# Patient Record
Sex: Female | Born: 1960 | Race: White | Marital: Married | Smoking: Never smoker
Health system: Southern US, Community
[De-identification: ages and names within clinical notes are randomized; demographics above are authoritative.]

## PROBLEM LIST (undated history)

## (undated) DIAGNOSIS — I00 Rheumatic fever without heart involvement: Secondary | ICD-10-CM

## (undated) DIAGNOSIS — K759 Inflammatory liver disease, unspecified: Secondary | ICD-10-CM

## (undated) HISTORY — PX: TONSILLECTOMY: SUR1361

## (undated) HISTORY — PX: ABDOMINAL HYSTERECTOMY: SHX81

---

## 2012-08-25 HISTORY — PX: JOINT REPLACEMENT: SHX530

## 2019-03-04 ENCOUNTER — Other Ambulatory Visit: Payer: Self-pay

## 2019-03-04 ENCOUNTER — Other Ambulatory Visit: Payer: Self-pay | Admitting: Family Medicine

## 2019-03-04 ENCOUNTER — Ambulatory Visit: Admission: RE | Admit: 2019-03-04 | Payer: Self-pay | Source: Ambulatory Visit

## 2019-03-04 ENCOUNTER — Ambulatory Visit
Admission: RE | Admit: 2019-03-04 | Discharge: 2019-03-04 | Disposition: A | Discharge status: Home / Self Care | Payer: BC Managed Care – PPO | Source: Ambulatory Visit | Attending: Family Medicine | Admitting: Family Medicine

## 2019-03-04 DIAGNOSIS — S79912A Unspecified injury of left hip, initial encounter: Secondary | ICD-10-CM | POA: Insufficient documentation

## 2019-03-06 ENCOUNTER — Ambulatory Visit: Admission: RE | Admit: 2019-03-06 | Payer: Self-pay | Source: Ambulatory Visit

## 2019-03-07 ENCOUNTER — Other Ambulatory Visit: Payer: Self-pay

## 2019-03-07 ENCOUNTER — Encounter
Admission: RE | Admit: 2019-03-07 | Discharge: 2019-03-07 | Disposition: A | Discharge status: Home / Self Care | Payer: BC Managed Care – PPO | Source: Ambulatory Visit | Attending: Orthopedic Surgery | Admitting: Orthopedic Surgery

## 2019-03-07 HISTORY — DX: Rheumatic fever without heart involvement: I00

## 2019-03-07 HISTORY — DX: Inflammatory liver disease, unspecified: K75.9

## 2019-03-07 LAB — SARS CORONAVIRUS 2 BY RT PCR (HOSPITAL ORDER, PERFORMED IN ~~LOC~~ HOSPITAL LAB): SARS Coronavirus 2: NEGATIVE

## 2019-03-07 LAB — CBC
HCT: 42.1 % (ref 36.0–46.0)
Hemoglobin: 13.8 g/dL (ref 12.0–15.0)
MCH: 27.7 pg (ref 26.0–34.0)
MCHC: 32.8 g/dL (ref 30.0–36.0)
MCV: 84.4 fL (ref 80.0–100.0)
Platelets: 231 10*3/uL (ref 150–400)
RBC: 4.99 MIL/uL (ref 3.87–5.11)
RDW: 13.4 % (ref 11.5–15.5)
WBC: 5 10*3/uL (ref 4.0–10.5)
nRBC: 0 % (ref 0.0–0.2)

## 2019-03-07 LAB — SEDIMENTATION RATE: Sed Rate: 2 mm/hr (ref 0–30)

## 2019-03-07 LAB — URINALYSIS, ROUTINE W REFLEX MICROSCOPIC
Bilirubin Urine: NEGATIVE
Glucose, UA: NEGATIVE mg/dL
Hgb urine dipstick: NEGATIVE
Ketones, ur: NEGATIVE mg/dL
Leukocytes,Ua: NEGATIVE
Nitrite: NEGATIVE
Protein, ur: NEGATIVE mg/dL
Specific Gravity, Urine: 1.024 (ref 1.005–1.030)
pH: 5 (ref 5.0–8.0)

## 2019-03-07 LAB — TYPE AND SCREEN
ABO/RH(D): O NEG
Antibody Screen: NEGATIVE

## 2019-03-07 LAB — BASIC METABOLIC PANEL
Anion gap: 9 (ref 5–15)
BUN: 17 mg/dL (ref 6–20)
CO2: 27 mmol/L (ref 22–32)
Calcium: 9.5 mg/dL (ref 8.9–10.3)
Chloride: 102 mmol/L (ref 98–111)
Creatinine, Ser: 0.55 mg/dL (ref 0.44–1.00)
GFR calc Af Amer: >60 mL/min (ref >60)
GFR calc non Af Amer: >60 mL/min (ref >60)
Glucose, Bld: 91 mg/dL (ref 70–99)
Potassium: 4 mmol/L (ref 3.5–5.1)
Sodium: 138 mmol/L (ref 135–145)

## 2019-03-07 LAB — SURGICAL PCR SCREEN
MRSA, PCR: NEGATIVE
Staphylococcus aureus: NEGATIVE

## 2019-03-07 LAB — PROTIME-INR
INR: 1 (ref 0.8–1.2)
Prothrombin Time: 12.8 seconds (ref 11.4–15.2)

## 2019-03-07 LAB — APTT: aPTT: 32 seconds (ref 24–36)

## 2019-03-07 MED ORDER — CEFAZOLIN SODIUM-DEXTROSE 2-4 GM/100ML-% IV SOLN
2.0000 g | Freq: Once | INTRAVENOUS | Status: AC
  Administered 2019-03-08: 2 g via INTRAVENOUS

## 2019-03-07 NOTE — Patient Instructions (Signed)
Your procedure is scheduled on: 03-08-19 TUESDAY Report to Same Day Surgery 2nd floor medical mall [facility] Entrance-take elevator on left to 2nd floor.  Check in with surgery information desk.) @ 2 PM   Remember: Instructions that are not followed completely may result in serious medical risk, up to and including death, or upon the discretion of your surgeon and anesthesiologist your surgery may need to be rescheduled.    _x___ 1. Do not eat food after midnight the night before your procedure. NO GUM OR CANDY AFTER MIDNIGHT. You may drink clear liquids up to 2 hours before you are scheduled to arrive at the hospital for your procedure.  Do not drink clear liquids within 2 hours of your scheduled arrival to the hospital.  Clear liquids include  --Water or Apple juice without pulp  --Clear carbohydrate beverage such as ClearFast or Gatorade  --Black Coffee or Clear Tea (No milk, no creamers, do not add anything to the coffee or Tea   ____Ensure clear carbohydrate drink on the way to the hospital for bariatric patients  ____Ensure clear carbohydrate drink 3 hours before surgery for Dr Dwyane Luo patients if physician instructed.     __x__ 2. No Alcohol for 24 hours before or after surgery.   __x__3. No Smoking or e-cigarettes for 24 prior to surgery.  Do not use any chewable tobacco products for at least 6 hour prior to surgery   ____  4. Bring all medications with you on the day of surgery if instructed.    __x__ 5. Notify your doctor if there is any change in your medical condition     (cold, fever, infections).    x___6. On the morning of surgery brush your teeth with toothpaste and water.  You may rinse your mouth with mouth wash if you wish.  Do not swallow any toothpaste or mouthwash.   Do not wear jewelry, make-up, hairpins, clips or nail polish.  Do not wear lotions, powders, or perfumes. You may wear deodorant.  Do not shave 48 hours prior to surgery. Men may shave face and  neck.  Do not bring valuables to the hospital.    [facility] is not responsible for any belongings or valuables.               Contacts, dentures or bridgework may not be worn into surgery.  Leave your suitcase in the car. After surgery it may be brought to your room.  For patients admitted to the hospital, discharge time is determined by your treatment team.  _  Patients discharged the day of surgery will not be allowed to drive home.  You will need someone to drive you home and stay with you the night of your procedure.    Please read over the following fact sheets that you were given:   [facility] Preparing for Surgery   ____ Take anti-hypertensive listed below, cardiac, seizure, asthma, anti-reflux and psychiatric medicines. These include:  1. YOU MAY TAKE HYDROCODONE DAY OF SURGERY IF NEEDED WITH A SMALL SIP OF WATER  2.  3.  4.  5.  6.  ____Fleets enema or Magnesium Citrate as directed.   _x___ Use CHG Soap or sage wipes as directed on instruction sheet   ____ Use inhalers on the day of surgery and bring to hospital day of surgery  ____ Stop Metformin and Janumet 2 days prior to surgery.    ____ Take 1/2 of usual insulin dose the night before surgery and none  on the morning surgery.   ____ Follow recommendations from Cardiologist, Pulmonologist or PCP regarding  stopping Aspirin, Coumadin, Plavix ,Eliquis, Effient, or Pradaxa, and Pletal.  X____Stop Anti-inflammatories such as Advil, Aleve, Ibuprofen, Motrin, Naproxen, Naprosyn, Goodies powders or aspirin products NOW-OK to take Tylenol OR HYDROCODONE    ____ Stop supplements until after surgery.     ____ Bring C-Pap to the hospital.

## 2019-03-08 ENCOUNTER — Inpatient Hospital Stay: Payer: BC Managed Care – PPO

## 2019-03-08 ENCOUNTER — Inpatient Hospital Stay: Payer: BC Managed Care – PPO | Admitting: Anesthesiology

## 2019-03-08 ENCOUNTER — Encounter: Payer: Self-pay | Admitting: *Deleted

## 2019-03-08 ENCOUNTER — Other Ambulatory Visit: Payer: Self-pay

## 2019-03-08 ENCOUNTER — Inpatient Hospital Stay
Admission: RE | Admit: 2019-03-08 | Discharge: 2019-03-09 | DRG: 482 | Disposition: A | Discharge status: Home Health | Payer: BC Managed Care – PPO | Attending: Orthopedic Surgery | Admitting: Orthopedic Surgery

## 2019-03-08 ENCOUNTER — Encounter
Admission: RE | Disposition: A | Discharge status: Home / Self Care | Payer: Self-pay | Source: Home / Self Care | Attending: Orthopedic Surgery

## 2019-03-08 DIAGNOSIS — Z96652 Presence of left artificial knee joint: Secondary | ICD-10-CM | POA: Diagnosis present

## 2019-03-08 DIAGNOSIS — W010XXA Fall on same level from slipping, tripping and stumbling without subsequent striking against object, initial encounter: Secondary | ICD-10-CM | POA: Diagnosis present

## 2019-03-08 DIAGNOSIS — S72002A Fracture of unspecified part of neck of left femur, initial encounter for closed fracture: Principal | ICD-10-CM | POA: Diagnosis present

## 2019-03-08 DIAGNOSIS — Z1159 Encounter for screening for other viral diseases: Secondary | ICD-10-CM

## 2019-03-08 DIAGNOSIS — Z90710 Acquired absence of both cervix and uterus: Secondary | ICD-10-CM | POA: Diagnosis not present

## 2019-03-08 DIAGNOSIS — Z419 Encounter for procedure for purposes other than remedying health state, unspecified: Secondary | ICD-10-CM

## 2019-03-08 DIAGNOSIS — S72009A Fracture of unspecified part of neck of unspecified femur, initial encounter for closed fracture: Secondary | ICD-10-CM | POA: Diagnosis present

## 2019-03-08 HISTORY — PX: HIP PINNING,CANNULATED: SHX1758

## 2019-03-08 LAB — URINE CULTURE

## 2019-03-08 LAB — ABO/RH: ABO/RH(D): O NEG

## 2019-03-08 SURGERY — FIXATION, FEMUR, NECK, PERCUTANEOUS, USING SCREW
Anesthesia: Spinal | Site: Hip | Laterality: Left

## 2019-03-08 MED ORDER — CEFAZOLIN SODIUM-DEXTROSE 2-4 GM/100ML-% IV SOLN
INTRAVENOUS | Status: AC
  Filled 2019-03-08: qty 100

## 2019-03-08 MED ORDER — TRAMADOL HCL 50 MG PO TABS
50.0000 mg | ORAL_TABLET | Freq: Four times a day (QID) | ORAL | Status: DC | PRN
  Administered 2019-03-08 – 2019-03-09 (×2): 50 mg via ORAL
  Filled 2019-03-08 (×2): qty 1

## 2019-03-08 MED ORDER — DIPHENHYDRAMINE HCL 12.5 MG/5ML PO ELIX: 12.5000 mg | ORAL_SOLUTION | ORAL | Status: DC | PRN

## 2019-03-08 MED ORDER — NEOMYCIN-POLYMYXIN B GU 40-200000 IR SOLN
Status: AC
  Filled 2019-03-08: qty 2

## 2019-03-08 MED ORDER — PROPOFOL 500 MG/50ML IV EMUL
INTRAVENOUS | Status: AC
  Filled 2019-03-08: qty 50

## 2019-03-08 MED ORDER — METOCLOPRAMIDE HCL 5 MG/ML IJ SOLN: 5.0000 mg | Freq: Three times a day (TID) | INTRAMUSCULAR | Status: DC | PRN

## 2019-03-08 MED ORDER — BUPIVACAINE HCL (PF) 0.5 % IJ SOLN
INTRAMUSCULAR | Status: DC | PRN
  Administered 2019-03-08: 3 mL

## 2019-03-08 MED ORDER — OXYCODONE HCL 5 MG PO TABS
5.0000 mg | ORAL_TABLET | ORAL | Status: DC | PRN
  Administered 2019-03-09 (×3): 10 mg via ORAL
  Filled 2019-03-08 (×3): qty 2

## 2019-03-08 MED ORDER — BUPIVACAINE LIPOSOME 1.3 % IJ SUSP
INTRAMUSCULAR | Status: AC
  Filled 2019-03-08: qty 20

## 2019-03-08 MED ORDER — BUPIVACAINE LIPOSOME 1.3 % IJ SUSP
INTRAMUSCULAR | Status: DC | PRN
  Administered 2019-03-08: 30 mL

## 2019-03-08 MED ORDER — HYDROMORPHONE HCL 1 MG/ML IJ SOLN: 0.2500 mg | INTRAMUSCULAR | Status: DC | PRN

## 2019-03-08 MED ORDER — FLEET ENEMA 7-19 GM/118ML RE ENEM: 1.0000 | ENEMA | Freq: Once | RECTAL | Status: DC | PRN

## 2019-03-08 MED ORDER — PROPOFOL 10 MG/ML IV BOLUS
INTRAVENOUS | Status: DC | PRN
  Administered 2019-03-08: 50 mg via INTRAVENOUS

## 2019-03-08 MED ORDER — MIDAZOLAM HCL 2 MG/2ML IJ SOLN
INTRAMUSCULAR | Status: AC
  Filled 2019-03-08: qty 2

## 2019-03-08 MED ORDER — FAMOTIDINE 20 MG PO TABS
20.0000 mg | ORAL_TABLET | Freq: Once | ORAL | Status: AC
  Administered 2019-03-08: 20 mg via ORAL

## 2019-03-08 MED ORDER — FENTANYL CITRATE (PF) 100 MCG/2ML IJ SOLN: 25.0000 ug | INTRAMUSCULAR | Status: DC | PRN

## 2019-03-08 MED ORDER — ONDANSETRON HCL 4 MG PO TABS: 4.0000 mg | ORAL_TABLET | Freq: Four times a day (QID) | ORAL | Status: DC | PRN

## 2019-03-08 MED ORDER — FENTANYL CITRATE (PF) 100 MCG/2ML IJ SOLN
INTRAMUSCULAR | Status: DC | PRN
  Administered 2019-03-08: 25 ug via INTRAVENOUS

## 2019-03-08 MED ORDER — OXYCODONE HCL 5 MG PO TABS
2.5000 mg | ORAL_TABLET | ORAL | Status: DC | PRN
  Administered 2019-03-08: 5 mg via ORAL
  Filled 2019-03-08: qty 1

## 2019-03-08 MED ORDER — SODIUM CHLORIDE 0.9 % IV SOLN
INTRAVENOUS | Status: DC
  Administered 2019-03-08 (×2): via INTRAVENOUS

## 2019-03-08 MED ORDER — OXYCODONE HCL 5 MG PO TABS: 5.0000 mg | ORAL_TABLET | Freq: Once | ORAL | Status: DC | PRN

## 2019-03-08 MED ORDER — ACETAMINOPHEN 500 MG PO TABS
1000.0000 mg | ORAL_TABLET | Freq: Three times a day (TID) | ORAL | Status: DC
  Administered 2019-03-08 – 2019-03-09 (×3): 1000 mg via ORAL
  Filled 2019-03-08 (×3): qty 2

## 2019-03-08 MED ORDER — METHOCARBAMOL 1000 MG/10ML IJ SOLN
500.0000 mg | Freq: Four times a day (QID) | INTRAVENOUS | Status: DC | PRN
  Filled 2019-03-08: qty 5

## 2019-03-08 MED ORDER — FAMOTIDINE 20 MG PO TABS
ORAL_TABLET | ORAL | Status: AC
  Filled 2019-03-08: qty 1

## 2019-03-08 MED ORDER — FENTANYL CITRATE (PF) 100 MCG/2ML IJ SOLN
INTRAMUSCULAR | Status: AC
  Filled 2019-03-08: qty 2

## 2019-03-08 MED ORDER — ACETAMINOPHEN 10 MG/ML IV SOLN
INTRAVENOUS | Status: AC
  Filled 2019-03-08: qty 100

## 2019-03-08 MED ORDER — ONDANSETRON HCL 4 MG/2ML IJ SOLN: 4.0000 mg | Freq: Four times a day (QID) | INTRAMUSCULAR | Status: DC | PRN

## 2019-03-08 MED ORDER — SENNOSIDES-DOCUSATE SODIUM 8.6-50 MG PO TABS: 1.0000 | ORAL_TABLET | Freq: Every evening | ORAL | Status: DC | PRN

## 2019-03-08 MED ORDER — METOCLOPRAMIDE HCL 10 MG PO TABS: 5.0000 mg | ORAL_TABLET | Freq: Three times a day (TID) | ORAL | Status: DC | PRN

## 2019-03-08 MED ORDER — ASPIRIN EC 325 MG PO TBEC
325.0000 mg | DELAYED_RELEASE_TABLET | Freq: Two times a day (BID) | ORAL | Status: DC
  Administered 2019-03-09: 325 mg via ORAL
  Filled 2019-03-08: qty 1

## 2019-03-08 MED ORDER — PHENYLEPHRINE HCL (PRESSORS) 10 MG/ML IV SOLN
INTRAVENOUS | Status: AC
  Filled 2019-03-08: qty 1

## 2019-03-08 MED ORDER — METHOCARBAMOL 500 MG PO TABS
500.0000 mg | ORAL_TABLET | Freq: Four times a day (QID) | ORAL | Status: DC | PRN
  Administered 2019-03-08 – 2019-03-09 (×2): 500 mg via ORAL
  Filled 2019-03-08 (×2): qty 1

## 2019-03-08 MED ORDER — BUPIVACAINE HCL (PF) 0.5 % IJ SOLN
INTRAMUSCULAR | Status: AC
  Filled 2019-03-08: qty 30

## 2019-03-08 MED ORDER — PROPOFOL 500 MG/50ML IV EMUL
INTRAVENOUS | Status: DC | PRN
  Administered 2019-03-08: 100 ug/kg/min via INTRAVENOUS

## 2019-03-08 MED ORDER — BISACODYL 10 MG RE SUPP: 10.0000 mg | Freq: Every day | RECTAL | Status: DC | PRN

## 2019-03-08 MED ORDER — ACETAMINOPHEN 10 MG/ML IV SOLN
INTRAVENOUS | Status: DC | PRN
  Administered 2019-03-08: 1000 mg via INTRAVENOUS

## 2019-03-08 MED ORDER — BUPIVACAINE HCL (PF) 0.5 % IJ SOLN
INTRAMUSCULAR | Status: AC
  Filled 2019-03-08: qty 10

## 2019-03-08 MED ORDER — OXYCODONE HCL 5 MG/5ML PO SOLN: 5.0000 mg | Freq: Once | ORAL | Status: DC | PRN

## 2019-03-08 MED ORDER — SODIUM CHLORIDE 0.9 % IR SOLN
Status: DC | PRN
  Administered 2019-03-08: 500 mL

## 2019-03-08 MED ORDER — EPHEDRINE SULFATE 50 MG/ML IJ SOLN
INTRAMUSCULAR | Status: DC | PRN
  Administered 2019-03-08 (×3): 10 mg via INTRAVENOUS

## 2019-03-08 MED ORDER — DOCUSATE SODIUM 100 MG PO CAPS
100.0000 mg | ORAL_CAPSULE | Freq: Two times a day (BID) | ORAL | Status: DC
  Administered 2019-03-08 – 2019-03-09 (×2): 100 mg via ORAL
  Filled 2019-03-08 (×2): qty 1

## 2019-03-08 MED ORDER — MIDAZOLAM HCL 5 MG/5ML IJ SOLN
INTRAMUSCULAR | Status: DC | PRN
  Administered 2019-03-08: 2 mg via INTRAVENOUS

## 2019-03-08 MED ORDER — CEFAZOLIN SODIUM-DEXTROSE 1-4 GM/50ML-% IV SOLN
1.0000 g | Freq: Four times a day (QID) | INTRAVENOUS | Status: AC
  Administered 2019-03-08 – 2019-03-09 (×2): 1 g via INTRAVENOUS
  Filled 2019-03-08 (×3): qty 50

## 2019-03-08 MED ORDER — EPHEDRINE SULFATE 50 MG/ML IJ SOLN
INTRAMUSCULAR | Status: AC
  Filled 2019-03-08: qty 1

## 2019-03-08 MED ORDER — LACTATED RINGERS IV SOLN
INTRAVENOUS | Status: DC
  Administered 2019-03-08: 15:00:00 via INTRAVENOUS

## 2019-03-08 SURGICAL SUPPLY — 46 items
BIT DRILL 4.9 CANNULATED (BIT) ×1
BIT DRILL CANN QC 4.9 LRG (BIT) IMPLANT
BLADE SURG 15 STRL LF DISP TIS (BLADE) ×1 IMPLANT
BLADE SURG 15 STRL SS (BLADE) ×1
CANISTER SUCT 1200ML W/VALVE (MISCELLANEOUS) ×2 IMPLANT
CHLORAPREP W/TINT 26 (MISCELLANEOUS) ×2 IMPLANT
COVER WAND RF STERILE (DRAPES) ×2 IMPLANT
DRAPE SHEET LG 3/4 BI-LAMINATE (DRAPES) ×3 IMPLANT
DRAPE SURG 17X11 SM STRL (DRAPES) ×4 IMPLANT
DRAPE U-SHAPE 47X51 STRL (DRAPES) ×4 IMPLANT
DRILL BIT CANNULATED 4.9 (BIT) ×1
DRSG OPSITE POSTOP 3X4 (GAUZE/BANDAGES/DRESSINGS) ×6 IMPLANT
ELECT REM PT RETURN 9FT ADLT (ELECTROSURGICAL) ×2
ELECTRODE REM PT RTRN 9FT ADLT (ELECTROSURGICAL) ×1 IMPLANT
GAUZE XEROFORM 1X8 LF (GAUZE/BANDAGES/DRESSINGS) ×2 IMPLANT
GLOVE BIOGEL PI IND STRL 8 (GLOVE) ×1 IMPLANT
GLOVE BIOGEL PI INDICATOR 8 (GLOVE) ×1
GLOVE SURG SYN 7.5  E (GLOVE) ×2
GLOVE SURG SYN 7.5 E (GLOVE) ×2 IMPLANT
GLOVE SURG SYN 7.5 PF PI (GLOVE) ×2 IMPLANT
GOWN STRL REUS W/ TWL LRG LVL3 (GOWN DISPOSABLE) ×1 IMPLANT
GOWN STRL REUS W/ TWL XL LVL3 (GOWN DISPOSABLE) ×1 IMPLANT
GOWN STRL REUS W/TWL LRG LVL3 (GOWN DISPOSABLE) ×1
GOWN STRL REUS W/TWL XL LVL3 (GOWN DISPOSABLE) ×1
GUIDEWIRE THRD ASNIS 3.2X300 (WIRE) ×3 IMPLANT
KIT TURNOVER CYSTO (KITS) ×2 IMPLANT
MAT ABSORB  FLUID 56X50 GRAY (MISCELLANEOUS) ×1
MAT ABSORB FLUID 56X50 GRAY (MISCELLANEOUS) ×2 IMPLANT
NDL FILTER BLUNT 18X1 1/2 (NEEDLE) ×1 IMPLANT
NEEDLE FILTER BLUNT 18X 1/2SAF (NEEDLE) ×1
NEEDLE FILTER BLUNT 18X1 1/2 (NEEDLE) ×1 IMPLANT
NEEDLE HYPO 22GX1.5 SAFETY (NEEDLE) ×2 IMPLANT
NS IRRIG 500ML POUR BTL (IV SOLUTION) ×2 IMPLANT
PACK HIP COMPR (MISCELLANEOUS) ×2 IMPLANT
PENCIL ELECTRO HAND CTR (MISCELLANEOUS) ×2 IMPLANT
SCREW ASNIS 100MM (Screw) ×1 IMPLANT
SCREW ASNIS 90MM (Screw) ×1 IMPLANT
SCREW ASNIS 95MM (Screw) ×1 IMPLANT
STAPLER SKIN PROX 35W (STAPLE) ×2 IMPLANT
SUT VIC AB 0 CT1 36 (SUTURE) ×2 IMPLANT
SUT VIC AB 2-0 CT1 27 (SUTURE) ×1
SUT VIC AB 2-0 CT1 TAPERPNT 27 (SUTURE) ×1 IMPLANT
SYR 30ML LL (SYRINGE) ×2 IMPLANT
SYR 5ML LL (SYRINGE) ×2 IMPLANT
TAPE CLOTH 3X10 WHT NS LF (GAUZE/BANDAGES/DRESSINGS) ×2 IMPLANT
WASHER SCREW MATTA SS 13.0X1.5 (Washer) ×3 IMPLANT

## 2019-03-08 NOTE — H&P (Signed)
Paper H&P to be scanned into permanent record. H&P reviewed. No significant changes noted.  

## 2019-03-08 NOTE — Op Note (Signed)
DATE OF SURGERY: 03/08/2019  PREOPERATIVE DIAGNOSIS: Left non-displaced femoral neck fracture  POSTOPERATIVE DIAGNOSIS: Left non-displaced femoral neck fracture  PROCEDURE: Percutaneous pinning of Left femoral neck fracture  SURGEON: Cato Mulligan, MD  ASSISTANTS: none  EBL: 50 cc  COMPONENTS:  Stryker 6.75mm cannulated screws x 3 with washers (148mm, 33mm, 24mm)   INDICATIONS: Alejandra Webb is a 58 y.o. female who sustained a non-displaced femoral neck fracture identified on MRI after she was knocked over by 2 dogs at a dog park one week ago. The patient had progressively worsening pain such that there is now significant pain even with slight movement of the hip and leg. Risks and benefits of percutaneous pinning were explained to the patient. Risks include but are not limited to bleeding, infection, injury to tissues, nerves, vessels, DVT/PE, malunion/nonunion, hardware failure, and risks of anesthesia. The patient understands these risks, has completed an informed consent, and wishes to proceed.   PROCEDURE:  The patient was brought into the operating room. After administering spinal anesthesia, the patient was placed in the supine position on the Hana table. The uninjured leg was extended while the injured lower extremity was placed in a neutral position with care taken to not displace the fracture during positioning. The lateral aspects of the operative hip and thigh were prepped with ChloraPrep solution before being draped sterilely. IV antibiotics were administered. A timeout was performed to verify the appropriate surgical site, patient, and procedure.   The greater trochanter was identified and an approximately 5 cm incision was made over the lateral aspect of the proximal femur just distal to the greater trochanter. The incision was carried down through the subcutaneous tissues to expose the IT band. This was split at the proximal portion of the incision and the vastus  lateralis was split in line with its fibers. The lateral aspect of the femur was cleared of soft tissue. Under fluoroscopic guidance, a guidewire was placed along the inferior aspect of the femoral neck into the head while ensuring the start point on the lateral cortex was not below the level of the lesser trochanter. A parallel guide was used to place two additional guidepins (superior posterior and superior anterior). Position of all pins was verified fluoroscopically in both the AP and lateral views. A measuring device was used the measure appropriate screw length. The near cortex was drilled. Appropriately sized screws were advanced starting with the inferior screw, then superior posterior, then superior anterior. Screws were sequentially tightened. Hardware position and bony alignment was confirmed fluoroscopically with AP and lateral views as well as live fluorsocopy.   The wounds were irrigated thoroughly with sterile saline solution. Local anesthetic was injected. The IT band was closed with 0-Vicryl. Deep fat sutures were also placed with 0-Vicryl. The subcutaneous tissues were closed using 2-0 Vicryl interrupted sutures. The skin was closed using staples. Sterile occlusive dressing was applied. The patient was then transferred to the recovery room in satisfactory condition after tolerating the procedure well.  POSTOPERATIVE PLAN: The patient will be FFWB on the operative extremity. ASA 325mg  bid x 4 weeks to start on POD#1 for DVT ppx. Ancef x 24 hours. PT/OT on POD#1.

## 2019-03-08 NOTE — Transfer of Care (Signed)
Immediate Anesthesia Transfer of Care Note  Patient: Rena Hunke  Procedure(s) Performed: Procedure(s): Percutanceous HIP PINNING (Left)  Patient Location: PACU  Anesthesia Type:Spinal  Level of Consciousness: awake, alert  and oriented  Airway & Oxygen Therapy: Patient Spontanous Breathing and Patient connected to face mask oxygen  Post-op Assessment: Report given to RN and Post -op Vital signs reviewed and stable  Post vital signs: Reviewed and stable  Last Vitals:  Vitals:   03/08/19 1434 03/08/19 1656  BP: 106/66 (!) 107/58  Pulse: (!) 55 (!) 58  Resp: 16 14  Temp: (!) 36 C (!) 35.9 C  SpO2: 03% 70%    Complications: No apparent anesthesia complications

## 2019-03-08 NOTE — Anesthesia Preprocedure Evaluation (Addendum)
Anesthesia Evaluation  Patient identified by MRN, date of birth, ID band Patient awake    Reviewed: Allergy & Precautions, H&P , NPO status , Patient's Chart, lab work & pertinent test results  Airway Mallampati: II  TM Distance: >3 FB Neck ROM: full    Dental  (+) Poor Dentition   Pulmonary neg pulmonary ROS, neg shortness of breath, neg COPD, neg recent URI,           Cardiovascular (-) angina(-) Past MI and (-) Cardiac Stents negative cardio ROS  (-) dysrhythmias      Neuro/Psych negative neurological ROS  negative psych ROS   GI/Hepatic negative GI ROS, (+) Hepatitis - (remote history)  Endo/Other  negative endocrine ROS  Renal/GU      Musculoskeletal   Abdominal   Peds  Hematology negative hematology ROS (+)   Anesthesia Other Findings Past Medical History: No date: Hepatitis     Comment:  a or b-pt unsure-32 years ago No date: Rheumatic fever  Past Surgical History: No date: ABDOMINAL HYSTERECTOMY No date: CESAREAN SECTION     Comment:  x2 2014: JOINT REPLACEMENT; Left     Comment:  tkr  No date: TONSILLECTOMY     Comment:  age 52's  BMI    Body Mass Index: 21.95 kg/m      Reproductive/Obstetrics negative OB ROS                           Anesthesia Physical Anesthesia Plan  ASA: I  Anesthesia Plan: Spinal   Post-op Pain Management:    Induction:   PONV Risk Score and Plan:   Airway Management Planned: Natural Airway and Nasal Cannula  Additional Equipment:   Intra-op Plan:   Post-operative Plan:   Informed Consent: I have reviewed the patients History and Physical, chart, labs and discussed the procedure including the risks, benefits and alternatives for the proposed anesthesia with the patient or authorized representative who has indicated his/her understanding and acceptance.     Dental Advisory Given  Plan Discussed with: Anesthesiologist and  CRNA  Anesthesia Plan Comments:        Anesthesia Quick Evaluation

## 2019-03-08 NOTE — Anesthesia Procedure Notes (Signed)
Spinal  Patient location during procedure: OR Start time: 03/08/2019 3:13 PM End time: 03/08/2019 3:19 PM Staffing Anesthesiologist: Durenda Hurt, MD Resident/CRNA: Doreen Salvage, CRNA Performed: resident/CRNA  Preanesthetic Checklist Completed: patient identified, site marked, surgical consent, pre-op evaluation, timeout performed, IV checked, risks and benefits discussed and monitors and equipment checked Spinal Block Patient position: sitting Prep: ChloraPrep Patient monitoring: heart rate, continuous pulse ox, blood pressure and cardiac monitor Approach: midline Location: L3-4 Injection technique: single-shot Needle Needle type: Whitacre and Introducer  Needle gauge: 24 G Needle length: 9 cm Assessment Sensory level: T10 Additional Notes Negative paresthesia. Negative blood return. Positive free-flowing CSF. Expiration date of kit checked and confirmed. Patient tolerated procedure well, without complications.

## 2019-03-08 NOTE — Anesthesia Procedure Notes (Signed)
Date/Time: 03/08/2019 3:14 PM Performed by: Doreen Salvage, CRNA Pre-anesthesia Checklist: Patient identified, Emergency Drugs available, Suction available and Patient being monitored Patient Re-evaluated:Patient Re-evaluated prior to induction Oxygen Delivery Method: Simple face mask Induction Type: IV induction Dental Injury: Teeth and Oropharynx as per pre-operative assessment

## 2019-03-08 NOTE — Anesthesia Post-op Follow-up Note (Signed)
Anesthesia QCDR form completed.        

## 2019-03-09 LAB — BASIC METABOLIC PANEL
Anion gap: 6 (ref 5–15)
BUN: 15 mg/dL (ref 6–20)
CO2: 27 mmol/L (ref 22–32)
Calcium: 8.6 mg/dL — ABNORMAL LOW (ref 8.9–10.3)
Chloride: 107 mmol/L (ref 98–111)
Creatinine, Ser: 0.63 mg/dL (ref 0.44–1.00)
GFR calc Af Amer: >60 mL/min (ref >60)
GFR calc non Af Amer: >60 mL/min (ref >60)
Glucose, Bld: 120 mg/dL — ABNORMAL HIGH (ref 70–99)
Potassium: 4.3 mmol/L (ref 3.5–5.1)
Sodium: 140 mmol/L (ref 135–145)

## 2019-03-09 LAB — CBC
HCT: 35.4 % — ABNORMAL LOW (ref 36.0–46.0)
Hemoglobin: 11.5 g/dL — ABNORMAL LOW (ref 12.0–15.0)
MCH: 27.3 pg (ref 26.0–34.0)
MCHC: 32.5 g/dL (ref 30.0–36.0)
MCV: 84.1 fL (ref 80.0–100.0)
Platelets: 193 10*3/uL (ref 150–400)
RBC: 4.21 MIL/uL (ref 3.87–5.11)
RDW: 13.3 % (ref 11.5–15.5)
WBC: 6.4 10*3/uL (ref 4.0–10.5)
nRBC: 0 % (ref 0.0–0.2)

## 2019-03-09 MED ORDER — ASPIRIN 325 MG PO TBEC: 325.0000 mg | DELAYED_RELEASE_TABLET | Freq: Two times a day (BID) | ORAL | 0 refills | Status: DC

## 2019-03-09 MED ORDER — OXYCODONE HCL 5 MG PO TABS: 5.0000 mg | ORAL_TABLET | ORAL | 0 refills | Status: DC | PRN

## 2019-03-09 MED ORDER — TRAMADOL HCL 50 MG PO TABS: 50.0000 mg | ORAL_TABLET | Freq: Four times a day (QID) | ORAL | 1 refills | Status: DC | PRN

## 2019-03-09 NOTE — Discharge Summary (Signed)
Physician Discharge Summary  Subjective: 1 Day Post-Op Procedure(s) (LRB): Percutanceous HIP PINNING (Left) Patient reports pain as moderate.   Patient seen in rounds with Dr. Allena KatzPatel. Patient is well, and has had no acute complaints or problems Patient is ready to go home with home health physical therapy.  Physician Discharge Summary  Patient ID: Alejandra Webb MRN: [id number] DOB/AGE: 60/10/1960 60 y.o.  Admit date: 03/08/2019 Discharge date: 03/09/2019  Admission Diagnoses:  Discharge Diagnoses:  Active Problems:   Hip fracture [facility](HCC)   Discharged Condition: fair  Hospital Course: The patient is postop day 1 from a left hip percutaneous pinning.  The patient has done well since surgery.  She is still working on pain management.  She will be doing physical therapy today before going home.  Treatments: surgery:  Percutaneous pinning of Left femoral neck fracture  SURGEON: Rosealee AlbeeSunny H. Patel, MD  ASSISTANTS: none  EBL: 50 cc  COMPONENTS:  Stryker 6.465mm cannulated screws x 3 with washers (100mm, 95mm, 90mm)   Discharge Exam: Blood pressure 117/61, pulse 65, temperature 98.5 F (36.9 C), temperature source Oral, resp. rate 19, height 5\' 6"  (1.676 m), weight 61.8 kg, SpO2 97 %.   Disposition: Discharge disposition: 01-Home or Self Care        Allergies as of 03/09/2019   No Known Allergies     Medication List    STOP taking these medications   HYDROcodone-acetaminophen 5-325 MG tablet Commonly known as: NORCO/VICODIN     TAKE these medications   aspirin 325 MG EC tablet Take 1 tablet (325 mg total) by mouth 2 (two) times a day.   ibuprofen 200 MG tablet Commonly known as: ADVIL Take 600 mg by mouth every 6 (six) hours as needed.   oxyCODONE 5 MG immediate release tablet Commonly known as: Oxy IR/ROXICODONE Take 1-2 tablets (5-10 mg total) by mouth every 4 (four) hours as needed for severe pain (pain score 7-10).   traMADol 50 MG  tablet Commonly known as: ULTRAM Take 1 tablet (50 mg total) by mouth every 6 (six) hours as needed for moderate pain.      Follow-up Information    Dedra SkeensMundy, Tracy Gerken, PA-C Follow up in 2 week(s).   Specialty: Orthopedic Surgery Why: For staple removal Contact information: [address] [facility] [place] [id number] [phone]           Signed: Lenard ForthMUNDY, Nataly Pacifico 03/09/2019, 7:00 AM   Objective: Vital signs in last 24 hours: Temp:  [96.7 F (35.9 C)-98.5 F (36.9 C)] 98.5 F (36.9 C) (07/15 0431) Pulse Rate:  [50-70] 65 (07/15 0431) Resp:  [11-20] 19 (07/15 0431) BP: (106-134)/(53-66) 117/61 (07/15 0431) SpO2:  [97 %-100 %] 97 % (07/15 0431) Weight:  [61.8 kg] 61.8 kg (07/14 2314)  Intake/Output from previous day:  Intake/Output Summary (Last 24 hours) at 03/09/2019 0700 Last data filed at 03/09/2019 0425 Gross per 24 hour  Intake 1518.66 ml  Output 1000 ml  Net 518.66 ml    Intake/Output this shift: Total I/O In: 718.7 [I.V.:618.7; IV Piggyback:100] Out: 200 [Urine:200]  Labs: Recent Labs    03/07/19 1324 03/09/19 0434  HGB 13.8 11.5*   Recent Labs    03/07/19 1324 03/09/19 0434  WBC 5.0 6.4  RBC 4.99 4.21  HCT 42.1 35.4*  PLT 231 193   Recent Labs    03/07/19 1324 03/09/19 0434  NA 138 140  K 4.0 4.3  CL 102 107  CO2 27 27  BUN 17 15  CREATININE 0.55 0.63  GLUCOSE 91 120*  CALCIUM 9.5 8.6*   Recent Labs    03/07/19 1324  INR 1.0    EXAM: General - Patient is Alert and Oriented Extremity - Neurovascular intact Sensation intact distally Dorsiflexion/Plantar flexion intact Compartment soft Incision - clean, dry, no drainage Motor Function -plantarflexion and dorsiflexion are intact  Assessment/Plan: 1 Day Post-Op Procedure(s) (LRB): Percutanceous HIP PINNING (Left) Procedure(s) (LRB): Percutanceous HIP PINNING (Left) Past Medical History:  Diagnosis Date  . Hepatitis    a or b-pt unsure-32 years ago  .  Rheumatic fever    Active Problems:   Hip fracture (HCC)  Estimated body mass index is 21.99 kg/m as calculated from the following:   Height as of this encounter: 5\' 6"  (1.676 m).   Weight as of this encounter: 61.8 kg. Advance diet Up with therapy D/C IV fluids Discharge home with home health Diet - Regular diet Follow up - in 2 weeks Activity -forward foot weight-bear Disposition - Home Condition Upon Discharge - Stable DVT Prophylaxis - Aspirin and TED hose  Reche Dixon, PA-C Orthopaedic Surgery 03/09/2019, 7:00 AM

## 2019-03-09 NOTE — TOC Progression Note (Signed)
Transition of Care [facility]) - Progression Note    Patient Details  Name: Cassidie Veiga MRN: [id number] Date of Birth: 04-16-1961  [facility] CM/SW [place], RN Phone Number: 03/09/2019, 3:50 PM  Clinical Narrative:     The patient decided she would  like a bed side commode, I notified Brad with [employer]  Expected Discharge Plan: [place] Barriers to Discharge: Barriers Resolved  Expected Discharge Plan and Services Expected Discharge Plan: [place]   Discharge Planning Services: CM Consult Post Acute Care Choice: [place] arrangements for the past 2 months: Apartment Expected Discharge Date: 03/09/19               DME Arranged: N/A         HH Arranged: PT, OT HH Agency: [facility] [place] Date [place]: 03/09/19 Time [place]: [phone] Representative spoke with at [place]: [place] ([place]) Interventions    Readmission Risk Interventions No flowsheet data found.

## 2019-03-09 NOTE — Evaluation (Signed)
Occupational Therapy Evaluation Patient Details Name: Alejandra Webb MRN: [id number] DOB: [birth year] Today's Date: 03/09/2019    History of Present Illness Pt is a 58 yo female diagnosed with a Left non-displaced femoral neck fracture from a mechanical fall who is now s/p Percutaneous pinning of Left femoral neck fracture.   Clinical Impression   Pt seen for OT evaluation this date POD #1 L hip pinning. Prior to hospital admission, pt was I with all ADLs/IADLs.  Pt lives with daughter in third story apartment with no elevator-[address].  Currently pt demonstrates impairments in LB ADLs and fxl transfers, requiring MIN A for LB dressing (SUPV with AE), and CGA with sit<>stand with crutches and for fxl mobility.  No further OT needs detected as pt demos good understanding of use of AE PRN as well as safe use of AD for ADL transfers and fxl mobility, and her daughter is available to assist upon pt d/c home.     Follow Up Recommendations  No OT follow up    Equipment Recommendations  Tub/shower seat    Recommendations for Other Services       Precautions / Restrictions Precautions Precautions: Fall Restrictions Weight Bearing Restrictions: Yes LLE Weight Bearing: Touchdown weight bearing(TTWB with foot flat position (FFWB)) Other Position/Activity Restrictions: LLE FFWB      Mobility Bed Mobility Overal bed mobility: Modified Independent             General bed mobility comments: pt up in chair when recieved by OT  Transfers Overall transfer level: Needs assistance Equipment used: Rolling walker (2 wheeled);Crutches Transfers: Sit to/from Stand Sit to Stand: Min guard         General transfer comment: Pt requires MIN verbal cues to sequence placement of hands/feet with use of crutches    Balance Overall balance assessment: Needs assistance Sitting-balance support: No upper extremity supported Sitting balance-Leahy Scale: Normal     Standing balance support:  Bilateral upper extremity supported Standing balance-Leahy Scale: Fair Standing balance comment: G+/F- static standing balance, requires CGA-SUPV level                           ADL either performed or assessed with clinical judgement   ADL Overall ADL's : Needs assistance/impaired Eating/Feeding: Independent   Grooming: Wash/dry hands;Wash/dry face;Set up   Upper Body Bathing: Set up   Lower Body Bathing: Supervison/ safety;Set up;Sitting/lateral leans   Upper Body Dressing : Set up   Lower Body Dressing: Minimal assistance   Toilet Transfer: Supervision/safety;Min guard Toilet Transfer Details (indicate cue type and reason): with crutches Toileting- Clothing Manipulation and Hygiene: Supervision/safety;Min guard Toileting - Clothing Manipulation Details (indicate cue type and reason): standing clothing mgt-requires CGA             Vision Baseline Vision/History: Wears glasses Wears Glasses: Reading only Patient Visual Report: No change from baseline       Perception     Praxis      Pertinent Vitals/Pain Pain Assessment: 0-10 Pain Score: 5  Pain Location: L hip Pain Descriptors / Indicators: Sore;Aching Pain Intervention(s): Limited activity within patient's tolerance;Monitored during session     Hand Dominance     Extremity/Trunk Assessment Upper Extremity Assessment Upper Extremity Assessment: Overall WFL for tasks assessed;RUE deficits/detail;LUE deficits/detail RUE Deficits / Details: 5/5 MMT shoulder, elbow, wrist in all planes LUE Deficits / Details: 5/5 MMT shoulder, elbow, wrist in all planes   Lower Extremity Assessment Lower Extremity Assessment:  Defer to PT evaluation;LLE deficits/detail LLE: Unable to fully assess due to pain       Communication Communication Communication: No difficulties   Cognition Arousal/Alertness: Awake/alert Behavior During Therapy: WFL for tasks assessed/performed Overall Cognitive Status: Within  Functional Limits for tasks assessed                                     General Comments       Exercises  Other Exercises Other Exercises: Education re: safety/fall prevention strategies including scanning environment for hazards, sequence of sit<>stand Other Exercises: introduction of LB dressing AE that pt can use PRN.   Shoulder Instructions      Home Living Family/patient expects to be discharged to:: Private residence Living Arrangements: Children Available Help at Discharge: Family;Available 24 hours/day Type of Home: Apartment Home Access: Stairs to enter Entrance Stairs-Number of Steps: 39 Entrance Stairs-Rails: Right;Left Home Layout: One level     Bathroom Shower/Tub: Chief Strategy OfficerTub/shower unit   Bathroom Toilet: Standard     Home Equipment: Crutches          Prior Functioning/Environment Level of Independence: Independent        Comments: Ind with amb community distances without an AD, no other fall history, current fall a result of being knocked down by a dog at a dog park, Ind with ADLs        OT Problem List: Decreased strength;Decreased activity tolerance;Decreased range of motion;Impaired balance (sitting and/or standing);Decreased knowledge of use of DME or AE      OT Treatment/Interventions:      OT Goals(Current goals can be found in the care plan section) Acute Rehab OT Goals Patient Stated Goal: To get stronger and return home OT Goal Formulation: All assessment and education complete, DC therapy  OT Frequency:     Barriers to D/C:            Co-evaluation              AM-PAC OT "6 Clicks" Daily Activity     Outcome Measure Help from another person eating meals?: None Help from another person taking care of personal grooming?: None Help from another person toileting, which includes using toliet, bedpan, or urinal?: A Little Help from another person bathing (including washing, rinsing, drying)?: A Little Help from  another person to put on and taking off regular upper body clothing?: None Help from another person to put on and taking off regular lower body clothing?: A Little 6 Click Score: 21   End of Session Equipment Utilized During Treatment: Gait belt;Rolling walker;Other (comment)(crutches)  Activity Tolerance: Patient tolerated treatment well Patient left: in chair;with call bell/phone within reach;with chair alarm set  OT Visit Diagnosis: Unsteadiness on feet (R26.81);Muscle weakness (generalized) (M62.81)                Time: [id number] OT Time Calculation (min): 38 min Charges:  OT General Charges $OT Visit: 1 Visit OT Evaluation $OT Eval Low Complexity: 1 Low OT Treatments $Self Care/Home Management : 8-22 mins $Therapeutic Activity: 8-22 mins  Rejeana Brocklison Kalyb Pemble, MS, OTR/L ascom [phone] or [phone] 03/09/19, 1:51 PM

## 2019-03-09 NOTE — Progress Notes (Signed)
Discharge summary reviewed with verbal understanding. Changed dressing per order, sent one home. Escorted to personal vehicle. Belongings packed upon discharge.

## 2019-03-09 NOTE — TOC Transition Note (Signed)
Transition of Care (TOC) - CM/SW Discharge Note   Patient Details  Name: Athelene Marie Belford MRN: [id number] Date of Birth: 03/31/1961  Transition of Care (TOC) CM/SW Contact:  Deliliah J Gregory, RN Phone Number: 03/09/2019, 8:36 AM   Clinical Narrative:    Met with the patient to discuss DC plan and needs The patient is staying on a 3rd floor apartment with her daughter, she has crutches at home and feels that she has no other needs for DME She agrees to home health PT and OT and [facility] has agreed to accept the patient and to see her in 3 days The patient is in agreeance, She will follow up in 2 weeks with doctor She can afford her medication She will DC with her daughter today   Final next level of care: Home w Home Health Services Barriers to Discharge: Barriers Resolved   Patient Goals and CMS Choice Patient states their goals for this hospitalization and ongoing recovery are:: go home CMS Medicare.gov Compare Post Acute Care list provided to:: Patient Choice offered to / list presented to : Patient  Discharge Placement                       Discharge Plan and Services   Discharge Planning Services: CM Consult Post Acute Care Choice: Home Health          DME Arranged: N/A         HH Arranged: PT, OT HH Agency: [facility] Date HH Agency Contacted: 03/09/19 Time HH Agency Contacted: 0833 Representative spoke with at HH Agency: Brittany  Social Determinants of Health (SDOH) Interventions     Readmission Risk Interventions No flowsheet data found.     

## 2019-03-09 NOTE — Progress Notes (Signed)
  Subjective: 1 Day Post-Op Procedure(s) (LRB): Percutanceous HIP PINNING (Left) Patient reports pain as moderate.   Patient seen in rounds with Dr. Posey Pronto. Patient is well, and has had no acute complaints or problems Plan is to go Home after hospital stay. Negative for chest pain and shortness of breath Fever: no Gastrointestinal: Negative for nausea and vomiting  Objective: Vital signs in last 24 hours: Temp:  [96.7 F (35.9 C)-98.5 F (36.9 C)] 98.5 F (36.9 C) (07/15 0431) Pulse Rate:  [50-70] 65 (07/15 0431) Resp:  [11-20] 19 (07/15 0431) BP: (106-134)/(53-66) 117/61 (07/15 0431) SpO2:  [97 %-100 %] 97 % (07/15 0431) Weight:  [61.8 kg] 61.8 kg (07/14 2314)  Intake/Output from previous day:  Intake/Output Summary (Last 24 hours) at 03/09/2019 0654 Last data filed at 03/09/2019 0425 Gross per 24 hour  Intake 1518.66 ml  Output 1000 ml  Net 518.66 ml    Intake/Output this shift: Total I/O In: 718.7 [I.V.:618.7; IV Piggyback:100] Out: 200 [Urine:200]  Labs: Recent Labs    03/07/19 1324 03/09/19 0434  HGB 13.8 11.5*   Recent Labs    03/07/19 1324 03/09/19 0434  WBC 5.0 6.4  RBC 4.99 4.21  HCT 42.1 35.4*  PLT 231 193   Recent Labs    03/07/19 1324 03/09/19 0434  NA 138 140  K 4.0 4.3  CL 102 107  CO2 27 27  BUN 17 15  CREATININE 0.55 0.63  GLUCOSE 91 120*  CALCIUM 9.5 8.6*   Recent Labs    03/07/19 1324  INR 1.0     EXAM General - Patient is Alert and Oriented Extremity - Neurovascular intact Sensation intact distally Dorsiflexion/Plantar flexion intact Compartment soft Dressing/Incision - clean, dry, no drainage Motor Function - intact, moving foot and toes well on exam.   Past Medical History:  Diagnosis Date  . Hepatitis    a or b-pt unsure-32 years ago  . Rheumatic fever     Assessment/Plan: 1 Day Post-Op Procedure(s) (LRB): Percutanceous HIP PINNING (Left) Active Problems:   Hip fracture (HCC)  Estimated body mass index  is 21.99 kg/m as calculated from the following:   Height as of this encounter: 5\' 6"  (1.676 m).   Weight as of this encounter: 61.8 kg. Advance diet Up with therapy D/C IV fluids Discharge home with home health  DVT Prophylaxis - Aspirin and TED hose Forward foot weight-Bearing  to left leg  Reche Dixon, PA-C Orthopaedic Surgery 03/09/2019, 6:54 AM

## 2019-03-09 NOTE — Progress Notes (Signed)
Physical Therapy Treatment Patient Details Name: Alejandra Webb MRN: [id number] DOB: 10/22/1960 Today's Date: 03/09/2019    History of Present Illness Pt is a 58 yo female diagnosed with a Left non-displaced femoral neck fracture from a mechanical fall who is now s/p Percutaneous pinning of Left femoral neck fracture.    PT Comments    Pt presents with min deficits in strength, transfers, mobility, gait, balance, and activity tolerance but is making good progress towards goals.  Pt was SBA with transfers with min verbal cues for sequencing.  Pt was SBA with amb with crutches x 150' with good compliance with WB status.  Pt was able to ascend and descend 4 steps x 2 with improved confidence and with only min verbal cues for sequencing.  Pt will benefit from HHPT services upon discharge to safely address above deficits for decreased caregiver assistance and eventual return to PLOF.     Follow Up Recommendations  Home health PT;Supervision for mobility/OOB     Equipment Recommendations  3in1 (PT)    Recommendations for Other Services       Precautions / Restrictions Precautions Precautions: Fall Restrictions Weight Bearing Restrictions: Yes LLE Weight Bearing: Touchdown weight bearing Other Position/Activity Restrictions: LLE FFWB    Mobility  Bed Mobility Overal bed mobility: Modified Independent             General bed mobility comments: NT, pt in chair  Transfers Overall transfer level: Needs assistance Equipment used: Crutches Transfers: Sit to/from Stand Sit to Stand: Supervision         General transfer comment: Pt requires MIN verbal cues to sequence placement of hands/feet with use of crutches  Ambulation/Gait Ambulation/Gait assistance: Supervision Gait Distance (Feet): 150 Feet Assistive device: Crutches Gait Pattern/deviations: Step-through pattern Gait velocity: decreased   General Gait Details: Min verbal cues for sequencing with gait  training with good compliance with WB status   Stairs Stairs: Yes Stairs assistance: Min guard;Supervision Stair Management: One rail Left;With crutches Number of Stairs: 4 General stair comments: Ascend/descend 4 stairs x 2 with one crutch and one rail with min verbal cues for sequencing as well as teach back method incorporated   Wheelchair Mobility    Modified Rankin (Stroke Patients Only)       Balance Overall balance assessment: Needs assistance Sitting-balance support: No upper extremity supported Sitting balance-Leahy Scale: Normal     Standing balance support: Bilateral upper extremity supported Standing balance-Leahy Scale: Good Standing balance comment: G+/F- static standing balance, requires CGA-SUPV level                            Cognition Arousal/Alertness: Awake/alert Behavior During Therapy: WFL for tasks assessed/performed Overall Cognitive Status: Within Functional Limits for tasks assessed                                        Exercises Total Joint Exercises Ankle Circles/Pumps: Strengthening;Both;15 reps;10 reps Quad Sets: Strengthening;Both;10 reps;15 reps Gluteal Sets: Strengthening;Both;10 reps;15 reps Long Arc Quad: AROM;Both;10 reps;15 reps Knee Flexion: AROM;Both;10 reps;15 reps Other Exercises Other Exercises: HEP education and review per handout Other Exercises: Pt education provided on proper guarding technique while ascending and descending stairs Other Exercises: Pt education provided on technique for sitting down to stairs if adverse symptoms occure while ascending or descending steps    General Comments  Pertinent Vitals/Pain Pain Assessment: 0-10 Pain Score: 4  Pain Location: L hip Pain Descriptors / Indicators: Sore;Aching Pain Intervention(s): Premedicated before session;Monitored during session    Home Living Family/patient expects to be discharged to:: Private residence Living  Arrangements: Children Available Help at Discharge: Family;Available 24 hours/day Type of Home: Apartment Home Access: Stairs to enter Entrance Stairs-Rails: Right;Left Home Layout: One level Home Equipment: Crutches      Prior Function Level of Independence: Independent      Comments: Ind with amb community distances without an AD, no other fall history, current fall a result of being knocked down by a dog at a dog park, Ind with ADLs   PT Goals (current goals can now be found in the care plan section) Acute Rehab PT Goals Patient Stated Goal: To get stronger and return home PT Goal Formulation: With patient Time For Goal Achievement: 03/22/19 Potential to Achieve Goals: Good Progress towards PT goals: Progressing toward goals    Frequency    BID      PT Plan Current plan remains appropriate    Co-evaluation              AM-PAC PT "6 Clicks" Mobility   Outcome Measure  Help needed turning from your back to your side while in a flat bed without using bedrails?: A Little Help needed moving from lying on your back to sitting on the side of a flat bed without using bedrails?: A Little Help needed moving to and from a bed to a chair (including a wheelchair)?: A Little Help needed standing up from a chair using your arms (e.g., wheelchair or bedside chair)?: A Little Help needed to walk in hospital room?: A Little Help needed climbing 3-5 steps with a railing? : A Little 6 Click Score: 18    End of Session Equipment Utilized During Treatment: Gait belt Activity Tolerance: Patient tolerated treatment well Patient left: in chair;with call bell/phone within reach;with chair alarm set;with SCD's reapplied Nurse Communication: Mobility status PT Visit Diagnosis: Other abnormalities of gait and mobility (R26.89);Muscle weakness (generalized) (M62.81)     Time: 9449-6759 PT Time Calculation (min) (ACUTE ONLY): 48 min  Charges:  $Gait Training: 23-37  mins $Therapeutic Exercise: 8-22 mins $Therapeutic Activity: 8-22 mins                     D. Scott Taunia Frasco PT, DPT 03/09/19, 3:14 PM

## 2019-03-09 NOTE — Discharge Instructions (Signed)
INSTRUCTIONS AFTER Surgery  o Remove items at home which could result in a fall. This includes throw rugs or furniture in walking pathways o ICE to the affected joint every three hours while awake for 30 minutes at a time, for at least the first 3-5 days, and then as needed for pain and swelling.  Continue to use ice for pain and swelling. You may notice swelling that will progress down to the foot and ankle.  This is normal after surgery.  Elevate your leg when you are not up walking on it.   o Continue to use the breathing machine you got in the hospital (incentive spirometer) which will help keep your temperature down.  It is common for your temperature to cycle up and down following surgery, especially at night when you are not up moving around and exerting yourself.  The breathing machine keeps your lungs expanded and your temperature down.   DIET:  As you were doing prior to hospitalization, we recommend a well-balanced diet.  DRESSING / WOUND CARE / SHOWERING  Dressing change as needed.  Keep the wound clean and dry.  The bandage is waterproof.  The staples will be removed in 2 weeks at [place]  o Increase activity slowly as tolerated, but follow the weight bearing instructions below.   o No driving for 6 weeks or until further direction given by your physician.  You cannot drive while taking narcotics.  o No lifting or carrying greater than 10 lbs. until further directed by your surgeon. o Avoid periods of inactivity such as sitting longer than an hour when not asleep. This helps prevent blood clots.  o You may return to work once you are authorized by your doctor.     WEIGHT BEARING  Forward foot weight-bear with a walker.   EXERCISES Gait training and strengthening.  Home health physical therapy  CONSTIPATION  Constipation is defined medically as fewer than three stools per week and severe constipation as less than one stool per week.  Even if you have a  regular bowel pattern at home, your normal regimen is likely to be disrupted due to multiple reasons following surgery.  Combination of anesthesia, postoperative narcotics, change in appetite and fluid intake all can affect your bowels.   YOU MUST use at least one of the following options; they are listed in order of increasing strength to get the job done.  They are all available over the counter, and you may need to use some, POSSIBLY even all of these options:    Drink plenty of fluids (prune juice may be helpful) and high fiber foods Colace 100 mg by mouth twice a day  Senokot for constipation as directed and as needed Dulcolax (bisacodyl), take with full glass of water  Miralax (polyethylene glycol) once or twice a day as needed.  If you have tried all these things and are unable to have a bowel movement in the first 3-4 days after surgery call either your surgeon or your primary doctor.    If you experience loose stools or diarrhea, hold the medications until you stool forms back up.  If your symptoms do not get better within 1 week or if they get worse, check with your doctor.  If you experience "the worst abdominal pain ever" or develop nausea or vomiting, please contact the office immediately for further recommendations for treatment.   ITCHING:  If you experience itching with your medications, try taking only a single pain  pill, or even half a pain pill at a time.  You can also use Benadryl over the counter for itching or also to help with sleep.   TED HOSE STOCKINGS:  Use stockings on both legs until for at least 2 weeks or as directed by physician office. They may be removed at night for sleeping.  MEDICATIONS:  See your medication summary on the After Visit Summary that nursing will review with you.  You may have some home medications which will be placed on hold until you complete the course of blood thinner medication.  It is important for you to complete the blood thinner  medication as prescribed.  PRECAUTIONS:  If you experience chest pain or shortness of breath - call 911 immediately for transfer to the hospital emergency department.   If you develop a fever greater that 101 F, purulent drainage from wound, increased redness or drainage from wound, foul odor from the wound/dressing, or calf pain - CONTACT YOUR SURGEON.                                                   FOLLOW-UP APPOINTMENTS:  If you do not already have a post-op appointment, please call the office for an appointment to be seen by your surgeon.  Guidelines for how soon to be seen are listed in your After Visit Summary, but are typically between 1-4 weeks after surgery.  OTHER INSTRUCTIONS:     MAKE SURE YOU:   Understand these instructions.   Get help right away if you are not doing well or get worse.    Thank you for letting us be a part of your medical care team.  It is a privilege we respect greatly.  We hope these instructions will help you stay on track for a fast and full recovery!

## 2019-03-09 NOTE — Anesthesia Postprocedure Evaluation (Signed)
Anesthesia Post Note  Patient: Alejandra Webb  Procedure(s) Performed: Percutanceous HIP PINNING (Left Hip)  Patient location during evaluation: Mother Baby Anesthesia Type: Spinal Level of consciousness: awake and alert and oriented Pain management: pain level controlled Vital Signs Assessment: post-procedure vital signs reviewed and stable Respiratory status: spontaneous breathing and nonlabored ventilation Cardiovascular status: stable Postop Assessment: no headache, no backache, no apparent nausea or vomiting, patient able to bend at knees, adequate PO intake and able to ambulate Anesthetic complications: no     Last Vitals:  Vitals:   03/09/19 0431 03/09/19 0732  BP: 117/61 (!) 111/53  Pulse: 65 70  Resp: 19 18  Temp: 36.9 C 36.7 C  SpO2: 97% 100%    Last Pain:  Vitals:   03/09/19 0732  TempSrc: Oral  PainSc:                  Lanora Manis

## 2019-03-09 NOTE — Evaluation (Signed)
Physical Therapy Evaluation Patient Details Name: Alejandra Webb MRN: [id number] DOB: 04/25/1961 Today's Date: 03/09/2019   History of Present Illness  Pt is a 58 yo female diagnosed with a Left non-displaced femoral neck fracture from a mechanical fall who is now s/p Percutaneous pinning of Left femoral neck fracture.    Clinical Impression  Pt presents with deficits in strength, transfers, mobility, gait, balance, and activity tolerance but overall performed well during the session.  Pt was Mod Ind with bed mobility tasks and CGA during transfer training from various height surfaces.  Pt ambulated 1 x 20' with a RW with CGA and mod verbal cues for sequencing.  After initial amb pt reported feeling "clammy" and returned to sitting.  BP 101/52, SpO2 96%, and HR 60 bpm.  Nursing entered room and gave pt an orange juice and some grapes.  Pt reported feeling much better and had no further symptoms during the session.   Pt then ambulated 1 x 30' and 1 x 150' with crutches with CGA and mod verbal cues for sequencing to ensure WB status compliance.  Pt practiced ascending and descending 4 steps x 3 with mod cues for sequencing with fair to good eccentric and concentric control.  LLE WB status maintained throughout the session with pt reporting no increase in L hip pain after session compared to baseline.  Pt will benefit from HHPT services upon discharge to safely address above deficits for decreased caregiver assistance and eventual return to PLOF.        Follow Up Recommendations Home health PT;Supervision for mobility/OOB    Equipment Recommendations  3in1 (PT);Other (comment)(Possible tub transfer bench, refer to OT eval to confirm)    Recommendations for Other Services       Precautions / Restrictions Precautions Precautions: Fall Restrictions Weight Bearing Restrictions: Yes LLE Weight Bearing: Touchdown weight bearing(TTWB with foot flat position (FFWB)) Other Position/Activity  Restrictions: LLE FFWB      Mobility  Bed Mobility Overal bed mobility: Modified Independent             General bed mobility comments: Extra time and effort but no physical assistance required  Transfers Overall transfer level: Needs assistance Equipment used: Rolling walker (2 wheeled);Crutches Transfers: Sit to/from Stand Sit to Stand: Min guard         General transfer comment: Sit to/from stand transfer training with both crutches as well a a RW with min-mod verbal cues for proper sequencing  Ambulation/Gait Ambulation/Gait assistance: Min guard Gait Distance (Feet): 150 Feet Assistive device: Rolling walker (2 wheeled);Crutches Gait Pattern/deviations: Step-to pattern Gait velocity: decreased   General Gait Details: Mod visual and verbal cues for proper sequencing during gait training to maintain WB status with crutches as well as with a RW  Stairs Stairs: Yes Stairs assistance: Min guard   Number of Stairs: 4 General stair comments: Ascend/descend 4 stairs x 3 with one crutch and one rail with visual and verbal cues for sequencing as well as teach back method incorporated  Wheelchair Mobility    Modified Rankin (Stroke Patients Only)       Balance Overall balance assessment: Mild deficits observed, not formally tested                                           Pertinent Vitals/Pain Pain Assessment: 0-10 Pain Score: 4  Pain Location: L hip Pain Descriptors /  Indicators: Sore;Aching Pain Intervention(s): Premedicated before session;Monitored during session    Home Living Family/patient expects to be discharged to:: Private residence Living Arrangements: Children Available Help at Discharge: Family;Available 24 hours/day Type of Home: Apartment Home Access: Stairs to enter Entrance Stairs-Rails: Right;Left(Too wide for both) Entrance Stairs-Number of Steps: 39(39 stairs with no elevator) Home Layout: One level Home Equipment:  Crutches      Prior Function Level of Independence: Independent         Comments: Ind with amb community distances without an AD, no other fall history, current fall a result of being knocked down by a dog at a dog park, Ind with ADLs     Hand Dominance        Extremity/Trunk Assessment   Upper Extremity Assessment Upper Extremity Assessment: Defer to OT evaluation    Lower Extremity Assessment Lower Extremity Assessment: Generalized weakness;LLE deficits/detail LLE: Unable to fully assess due to pain       Communication   Communication: No difficulties  Cognition Arousal/Alertness: Awake/alert Behavior During Therapy: WFL for tasks assessed/performed Overall Cognitive Status: Within Functional Limits for tasks assessed                                        General Comments      Exercises Total Joint Exercises Ankle Circles/Pumps: AROM;Both;10 reps;15 reps Quad Sets: Strengthening;Both;10 reps;15 reps Gluteal Sets: Strengthening;Both;10 reps;15 reps Long Arc Quad: AROM;Both;10 reps;15 reps Knee Flexion: AROM;Both;10 reps;15 reps Other Exercises Other Exercises: Car transfer training using a chair to simulate a car with verbal and visual cues for sequencing Other Exercises: HEP education for BLE APs, GS, QS, and LAQ x 10 each every 1-2 hours daily   Assessment/Plan    PT Assessment Patient needs continued PT services  PT Problem List Decreased strength;Decreased activity tolerance;Decreased balance;Decreased mobility;Decreased knowledge of use of DME       PT Treatment Interventions DME instruction;Gait training;Stair training;Functional mobility training;Therapeutic activities;Therapeutic exercise;Balance training;Patient/family education    PT Goals (Current goals can be found in the Care Plan section)  Acute Rehab PT Goals Patient Stated Goal: To get stronger and return home PT Goal Formulation: With patient Time For Goal Achievement:  03/22/19 Potential to Achieve Goals: Good    Frequency BID   Barriers to discharge        Co-evaluation               AM-PAC PT "6 Clicks" Mobility  Outcome Measure Help needed turning from your back to your side while in a flat bed without using bedrails?: A Little Help needed moving from lying on your back to sitting on the side of a flat bed without using bedrails?: A Little Help needed moving to and from a bed to a chair (including a wheelchair)?: A Little Help needed standing up from a chair using your arms (e.g., wheelchair or bedside chair)?: A Little Help needed to walk in hospital room?: A Little Help needed climbing 3-5 steps with a railing? : A Little 6 Click Score: 18    End of Session Equipment Utilized During Treatment: Gait belt Activity Tolerance: Patient tolerated treatment well Patient left: in chair;with call bell/phone within reach;with chair alarm set;with SCD's reapplied Nurse Communication: Mobility status PT Visit Diagnosis: Other abnormalities of gait and mobility (R26.89);Muscle weakness (generalized) (M62.81)    Time: [id number] PT Time Calculation (min) (ACUTE ONLY): 95 min  Charges:     PT Treatments $Gait Training: 23-37 mins $Therapeutic Exercise: 8-22 mins $Therapeutic Activity: 8-22 mins        D. Scott Shaquill Iseman PT, DPT 03/09/19, 1:11 PM

## 2019-08-10 ENCOUNTER — Other Ambulatory Visit: Payer: Self-pay

## 2019-08-10 ENCOUNTER — Ambulatory Visit: Payer: BLUE CROSS/BLUE SHIELD | Attending: Internal Medicine

## 2019-08-10 DIAGNOSIS — Z20828 Contact with and (suspected) exposure to other viral communicable diseases: Secondary | ICD-10-CM | POA: Insufficient documentation

## 2019-08-10 DIAGNOSIS — Z20822 Contact with and (suspected) exposure to covid-19: Secondary | ICD-10-CM

## 2019-08-11 NOTE — Progress Notes (Signed)
Order(s) created erroneously. Erroneous order ID: [id number]  Order moved by: Breeona Waid M  Order move date/time: 08/11/2019 6:47 PM  Source Patient: [id number]  Source Contact: 08/10/2019  Destination Patient: [id number]  Destination Contact: 08/10/2019 

## 2019-08-11 NOTE — Progress Notes (Signed)
Moving orders to this encounter.  

## 2019-08-11 NOTE — Progress Notes (Signed)
Order(s) created erroneously. Erroneous order ID: [id number]  Order moved by: Brigitte Pulse  Order move date/time: 08/11/2019 6:47 PM  Source Patient: [id number]  Source Contact: 08/10/2019  Destination Patient: [id number]  Destination Contact: 08/10/2019

## 2019-08-12 LAB — NOVEL CORONAVIRUS, NAA: SARS-CoV-2, NAA: NOT DETECTED

## 2020-05-14 ENCOUNTER — Other Ambulatory Visit: Payer: BLUE CROSS/BLUE SHIELD

## 2020-07-17 ENCOUNTER — Ambulatory Visit: Payer: BLUE CROSS/BLUE SHIELD | Admitting: Dermatology

## 2020-08-24 ENCOUNTER — Emergency Department
Admission: EM | Admit: 2020-08-24 | Discharge: 2020-08-24 | Disposition: A | Discharge status: Home / Self Care | Payer: BLUE CROSS/BLUE SHIELD | Attending: Emergency Medicine | Admitting: Emergency Medicine

## 2020-08-24 ENCOUNTER — Emergency Department: Payer: BLUE CROSS/BLUE SHIELD

## 2020-08-24 ENCOUNTER — Other Ambulatory Visit: Payer: Self-pay

## 2020-08-24 DIAGNOSIS — U071 COVID-19: Secondary | ICD-10-CM

## 2020-08-24 DIAGNOSIS — Z7982 Long term (current) use of aspirin: Secondary | ICD-10-CM | POA: Diagnosis not present

## 2020-08-24 DIAGNOSIS — R0602 Shortness of breath: Secondary | ICD-10-CM | POA: Diagnosis present

## 2020-08-24 LAB — CBC
HCT: 43.9 % (ref 36.0–46.0)
Hemoglobin: 14.5 g/dL (ref 12.0–15.0)
MCH: 27.3 pg (ref 26.0–34.0)
MCHC: 33 g/dL (ref 30.0–36.0)
MCV: 82.5 fL (ref 80.0–100.0)
Platelets: 242 10*3/uL (ref 150–400)
RBC: 5.32 MIL/uL — ABNORMAL HIGH (ref 3.87–5.11)
RDW: 13.2 % (ref 11.5–15.5)
WBC: 4.7 10*3/uL (ref 4.0–10.5)
nRBC: 0 % (ref 0.0–0.2)

## 2020-08-24 LAB — BASIC METABOLIC PANEL
Anion gap: 12 (ref 5–15)
BUN: 16 mg/dL (ref 8–23)
CO2: 24 mmol/L (ref 22–32)
Calcium: 10 mg/dL (ref 8.9–10.3)
Chloride: 102 mmol/L (ref 98–111)
Creatinine, Ser: 0.66 mg/dL (ref 0.44–1.00)
GFR, Estimated: >60 mL/min (ref >60)
Glucose, Bld: 99 mg/dL (ref 70–99)
Potassium: 4.1 mmol/L (ref 3.5–5.1)
Sodium: 138 mmol/L (ref 135–145)

## 2020-08-24 MED ORDER — DEXAMETHASONE SODIUM PHOSPHATE 10 MG/ML IJ SOLN
8.0000 mg | Freq: Once | INTRAMUSCULAR | Status: AC
  Administered 2020-08-24: 8 mg via INTRAMUSCULAR
  Filled 2020-08-24: qty 1

## 2020-08-24 NOTE — ED Notes (Signed)
Pt verbalized understanding of d/c instructions at this time. Pt denies further questions

## 2020-08-24 NOTE — ED Triage Notes (Signed)
Pt presents via POV c/o increased SOB last PM. Report Covid+ test on Sunday. Reports tingling in hands and feet.

## 2020-08-24 NOTE — ED Notes (Signed)
ED Provider at bedside. 

## 2020-08-24 NOTE — ED Provider Notes (Signed)
[facility] Emergency Department Provider Note   ____________________________________________    I have reviewed the triage vital signs and the nursing notes.   HISTORY  Chief Complaint Shortness of Breath     HPI Kaely Alayziah Tangeman is a 59 y.o. female who tested positive for COVID-19 5 days ago, has had symptoms for about 8 days.  She has been vaccinated but has not had booster.  Complains of cough and mild tightness in chest.  Occasional tingling in her fingers in the leg.  No headache, known muscle weakness.  Some myalgias.  Continues to feel fatigued and exhausted with exertion.  Cough is getting better  Past Medical History:  Diagnosis Date  . Hepatitis    a or b-pt unsure-32 years ago  . Rheumatic fever     Patient Active Problem List   Diagnosis Date Noted  . Hip fracture (HCC) 03/08/2019    Past Surgical History:  Procedure Laterality Date  . ABDOMINAL HYSTERECTOMY    . CESAREAN SECTION     x2  . HIP PINNING,CANNULATED Left 03/08/2019   Procedure: Percutanceous HIP PINNING;  Surgeon: Signa Kell, MD;  Location: [facility] ORS;  Service: Orthopedics;  Laterality: Left;  . JOINT REPLACEMENT Left 2014   tkr   . TONSILLECTOMY     age 55's    Prior to Admission medications   Medication Sig Start Date End Date Taking? Authorizing Provider  aspirin EC 325 MG EC tablet Take 1 tablet (325 mg total) by mouth 2 (two) times a day. 03/09/19   Dedra Skeens, PA-C  ibuprofen (ADVIL) 200 MG tablet Take 600 mg by mouth every 6 (six) hours as needed.    [provider]  oxyCODONE (OXY IR/ROXICODONE) 5 MG immediate release tablet Take 1-2 tablets (5-10 mg total) by mouth every 4 (four) hours as needed for severe pain (pain score 7-10). 03/09/19   Dedra Skeens, PA-C  traMADol (ULTRAM) 50 MG tablet Take 1 tablet (50 mg total) by mouth every 6 (six) hours as needed for moderate pain. 03/09/19   Dedra Skeens, PA-C     Allergies Patient has no known  allergies.  History reviewed. No pertinent family history.  Social History Social History   Tobacco Use  . Smoking status: Never Smoker  . Smokeless tobacco: Never Used  Vaping Use  . Vaping Use: Never used  Substance Use Topics  . Alcohol use: Never  . Drug use: Never    Review of Systems  Constitutional: No fever/chills Eyes: No visual changes.  ENT: No sore throat. Cardiovascular: As above Respiratory: As above Gastrointestinal: No abdominal pain.  No nausea, no vomiting.   Genitourinary: Negative for dysuria. Musculoskeletal: Myalgias Skin: Negative for rash. Neurological: Negative for headaches   ____________________________________________   PHYSICAL EXAM:  VITAL SIGNS: ED Triage Vitals  Enc Vitals Group     BP 08/24/20 1044 117/64     Pulse Rate 08/24/20 1044 86     Resp 08/24/20 1044 16     Temp 08/24/20 1044 98.3 F (36.8 C)     Temp Source 08/24/20 1044 Oral     SpO2 08/24/20 1044 98 %     Weight --      Height --      Head Circumference --      Peak Flow --      Pain Score 08/24/20 1045 4     Pain Loc --      Pain Edu? --  Excl. in GC? --     Constitutional: Alert and oriented.  Nose: No congestion/rhinnorhea. Mouth/Throat: Mucous membranes are moist.   Neck:  Painless ROM Cardiovascular: Normal rate, regular rhythm.  Good peripheral circulation. Respiratory: Normal respiratory effort.  No retractions. Lungs CTAB.  No wheezing Gastrointestinal: Soft and nontender. No distention.  No CVA tenderness.  Musculoskeletal: Right upper extremity: Mild tenderness to the bicep, no swelling or abnormality, normal range of motion Neurologic:  Normal speech and language. No gross focal neurologic deficits are appreciated.  Skin:  Skin is warm, dry and intact. No rash noted. Psychiatric: Mood and affect are normal. Speech and behavior are normal.  ____________________________________________   LABS (all labs ordered are listed, but only  abnormal results are displayed)  Labs Reviewed  CBC - Abnormal; Notable for the following components:      Result Value   RBC 5.32 (*)    All other components within normal limits  BASIC METABOLIC PANEL   ____________________________________________  EKG  ED ECG REPORT I, Jene Every, the attending physician, personally viewed and interpreted this ECG.  Date: 08/24/2020  Rhythm: normal sinus rhythm QRS Axis: normal Intervals: normal ST/T Wave abnormalities: normal Narrative Interpretation: no evidence of acute ischemia  ____________________________________________  RADIOLOGY  Chest x-ray viewed by me, no infiltrate or effusion ____________________________________________   PROCEDURES  Procedure(s) performed: No  Procedures   Critical Care performed: No ____________________________________________   INITIAL IMPRESSION / ASSESSMENT AND PLAN / ED COURSE  Pertinent labs & imaging results that were available during my care of the patient were reviewed by me and considered in my medical decision making (see chart for details).  Patient with known COVID-19 presents with fatigue, myalgias, improving cough and some chest tightness  EKG is reassuring, lab work is unremarkable.  Chest x-ray does not demonstrate any evidence of pneumonia.  Patient is not a smoker, no wheezing on exams.  Treated with IM Decadron for myalgias and symptom relief.  Recommended continued supportive care and that the symptoms are consistent with COVID-19 infection    ____________________________________________   FINAL CLINICAL IMPRESSION(S) / ED DIAGNOSES  Final diagnoses:  COVID-19        Note:  This document was prepared using Dragon voice recognition software and may include unintentional dictation errors.   Jene Every, MD 08/24/20 [phone]

## 2020-08-29 ENCOUNTER — Telehealth: Payer: Self-pay

## 2020-08-29 NOTE — Telephone Encounter (Signed)
      Copied from CRM [id number]. Topic: General - Other >> Aug 29, 2020 10:07 AM Dalphine Handing A wrote: Patient failed DT, therefore made new patient appointment virtual. Patient is recovering from Covid diagnosis 08/24/20. Please advise

## 2020-08-29 NOTE — Telephone Encounter (Signed)
Please review.  Pt has an appointment to establish care with you on 08/31/2020  Thanks,   -Vernona Rieger

## 2020-08-31 ENCOUNTER — Encounter: Payer: Self-pay | Admitting: Physician Assistant

## 2020-08-31 ENCOUNTER — Telehealth (INDEPENDENT_AMBULATORY_CARE_PROVIDER_SITE_OTHER): Payer: BLUE CROSS/BLUE SHIELD | Admitting: Physician Assistant

## 2020-08-31 VITALS — Ht 66.0 in

## 2020-08-31 DIAGNOSIS — G629 Polyneuropathy, unspecified: Secondary | ICD-10-CM | POA: Diagnosis not present

## 2020-08-31 DIAGNOSIS — S72002S Fracture of unspecified part of neck of left femur, sequela: Secondary | ICD-10-CM

## 2020-08-31 DIAGNOSIS — M810 Age-related osteoporosis without current pathological fracture: Secondary | ICD-10-CM

## 2020-08-31 DIAGNOSIS — J029 Acute pharyngitis, unspecified: Secondary | ICD-10-CM | POA: Diagnosis not present

## 2020-08-31 DIAGNOSIS — U071 COVID-19: Secondary | ICD-10-CM | POA: Insufficient documentation

## 2020-08-31 MED ORDER — GABAPENTIN 300 MG PO CAPS: 300.0000 mg | ORAL_CAPSULE | Freq: Three times a day (TID) | ORAL | 3 refills | Status: AC

## 2020-08-31 MED ORDER — AMOXICILLIN 875 MG PO TABS: 875.0000 mg | ORAL_TABLET | Freq: Two times a day (BID) | ORAL | 0 refills | Status: AC

## 2020-08-31 MED ORDER — PREDNISONE 10 MG PO TABS: ORAL_TABLET | ORAL | 0 refills | Status: DC

## 2020-08-31 NOTE — Progress Notes (Signed)
MyChart Video Visit    Virtual Visit via Video Note   This visit type was conducted due to national recommendations for restrictions regarding the COVID-19 Pandemic (e.g. social distancing) in an effort to limit this patient's exposure and mitigate transmission in our community. This patient is at least at moderate risk for complications without adequate follow up. This format is felt to be most appropriate for this patient at this time. Physical exam was limited by quality of the video and audio technology used for the visit.   Patient location: Home Provider location: Office   I discussed the limitations of evaluation and management by telemedicine and the availability of in person appointments. The patient expressed understanding and agreed to proceed.  Patient: Alejandra Webb   DOB: 06/25/1961   60 y.o. Female  MRN: [id number] Visit Date: 08/31/2020  Today's healthcare provider: Trey Sailors, PA-C   Chief Complaint  Patient presents with  . New Patient (Initial Visit)  I,Charlsey Moragne M Leviticus Harton,acting as a scribe for Trey Sailors, PA-C.,have documented all relevant documentation on the behalf of Trey Sailors, PA-C,as directed by  Trey Sailors, PA-C while in the presence of Trey Sailors, PA-C.  Subjective    Arm Pain  The incident occurred 5 to 7 days ago. There was no injury mechanism. The pain is present in the right elbow. The quality of the pain is described as aching. The pain radiates to the right hand. The pain is at a severity of 4/10. The pain is mild. The pain has been intermittent since the incident. Associated symptoms include numbness and tingling. Pertinent negatives include no chest pain. The symptoms are aggravated by movement.  Patient reports going to the hospital and they stated pain maybe from her having covid.  New Patient Appointment She moved here from [place] to [place] in the past year. 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   Mammogram: 10 years ago Colon Cancer Screening: 5 years ago, told to come back in 7 years PAP smear: 10 years ago, she has had a total hysterectomy in 1996. She believes she has her cervix.   Osteoporosis: diagnosed fall 2020 after left hip fracture treated with pinning. She was diagnosed but never treated for this.   Family History: valvular issues runs strongly in her family, she is not sure which valve it is but reports she has seen a cardiologist and was told she did not have this issue.  COVID 19: Patient reports she tested positive for covid 10 days ago and is still having some symtpoms. 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 She is continuing to have throat pain which began with initial COVID infection. She was most recently seen in the ER on 08/24/2020 for worsening symptoms. She is concerned about sore throat that persists due to history of rheumatic fever.    Depression screen [facility] 2/9 08/31/2020  Decreased Interest 0  Down, Depressed, Hopeless 0  PHQ - 2 Score 0  Altered sleeping 2  Tired, decreased energy 2  Change in appetite 0  Feeling bad or failure about yourself  0  Trouble concentrating 0  Moving slowly or fidgety/restless 0  Suicidal thoughts 0  PHQ-9 Score 4  Difficult doing work/chores Somewhat difficult     Medications: Outpatient Medications Prior to Visit  Medication Sig  . ibuprofen (ADVIL)  200 MG tablet Take 600 mg by mouth every 6 (six) hours as needed.  Marland Kitchen aspirin EC 325 MG EC tablet Take 1 tablet (325 mg total) by mouth 2 (two) times a day. (Patient not taking: Reported on 08/31/2020)  . oxyCODONE (OXY IR/ROXICODONE) 5 MG immediate release tablet Take 1-2 tablets (5-10 mg total) by mouth every 4 (four) hours as needed for severe pain (pain score 7-10). (Patient not taking: Reported on 08/31/2020)  . traMADol (ULTRAM) 50 MG tablet Take 1 tablet (50 mg  total) by mouth every 6 (six) hours as needed for moderate pain. (Patient not taking: Reported on 08/31/2020)   No facility-administered medications prior to visit.    Review of Systems  HENT: Positive for sore throat.   Eyes: Negative.   Respiratory: Negative.   Cardiovascular: Negative for chest pain.  Gastrointestinal: Negative.   Genitourinary: Negative.   Musculoskeletal: Negative.   Skin: Negative.   Allergic/Immunologic: Negative.   Neurological: Positive for tingling and numbness.  Hematological: Negative.   Psychiatric/Behavioral: Negative.       Objective    Ht 5\' 6"  (1.676 m)   BMI 21.99 kg/m    Physical Exam Constitutional:      Appearance: Normal appearance.  Pulmonary:     Effort: Pulmonary effort is normal. No respiratory distress.  Neurological:     Mental Status: She is alert.  Psychiatric:        Mood and Affect: Mood normal.        Behavior: Behavior normal.        Assessment & Plan    1. COVID-19  Counseled on expectant management.   2. Closed fracture of left hip, sequela   3. Sore throat  Can get swabbed for strep this afternoon, amoxicillin given if symptoms worsen.  - Culture, Group A Strep - amoxicillin (AMOXIL) 875 MG tablet; Take 1 tablet (875 mg total) by mouth 2 (two) times daily for 10 days.  Dispense: 20 tablet; Refill: 0 - predniSONE (DELTASONE) 10 MG tablet; Take 6 pills on day 1, 5 pills on day 2 and so on until complete.  Dispense: 21 tablet; Refill: 0  4. Neuropathy  Will give trial of gabapentin to see if this helps symptoms.  - gabapentin (NEURONTIN) 300 MG capsule; Take 1 capsule (300 mg total) by mouth 3 (three) times daily.  Dispense: 90 capsule; Refill: 3  5. Osteoporosis, unspecified osteoporosis type, unspecified pathological fracture presence  Diagnosed but hasn't pursued treatment. Can discuss further treatment at CPE, recommend follow up.     Return if symptoms worsen or fail to improve.     I  discussed the assessment and treatment plan with the patient. The patient was provided an opportunity to ask questions and all were answered. The patient agreed with the plan and demonstrated an understanding of the instructions.   The patient was advised to call back or seek an in-person evaluation if the symptoms worsen or if the condition fails to improve as anticipated.   I , PA-C, have reviewed all documentation for this visit. The documentation on 09/04/20 for the exam, diagnosis, procedures, and orders are all accurate and complete.  The entirety of the information documented in the History of Present Illness, Review of Systems and Physical Exam were personally obtained by me. Portions of this information were initially documented by [facility] and reviewed by me for thoroughness and accuracy.    [facility] [phone] (phone) [phone] (fax)  [place]

## 2020-09-04 DIAGNOSIS — M810 Age-related osteoporosis without current pathological fracture: Secondary | ICD-10-CM | POA: Insufficient documentation

## 2020-09-06 ENCOUNTER — Telehealth: Payer: Self-pay

## 2020-09-06 NOTE — Telephone Encounter (Signed)
Patient was scheduled for physical on 02/06/2021.

## 2020-09-06 NOTE — Telephone Encounter (Signed)
-----   Message from Trey Sailors, [place] sent at 09/04/2020 12:04 PM EST ----- Can we please schedule CPE in a few months? Thank you.

## 2021-02-06 ENCOUNTER — Encounter: Payer: BLUE CROSS/BLUE SHIELD | Admitting: Physician Assistant

## 2021-03-05 ENCOUNTER — Other Ambulatory Visit: Payer: Self-pay | Admitting: Family Medicine

## 2021-03-05 ENCOUNTER — Other Ambulatory Visit (HOSPITAL_COMMUNITY): Payer: Self-pay | Admitting: Family Medicine

## 2021-03-05 DIAGNOSIS — R1032 Left lower quadrant pain: Secondary | ICD-10-CM

## 2021-03-05 DIAGNOSIS — R112 Nausea with vomiting, unspecified: Secondary | ICD-10-CM

## 2021-03-05 DIAGNOSIS — K922 Gastrointestinal hemorrhage, unspecified: Secondary | ICD-10-CM

## 2021-06-07 ENCOUNTER — Encounter: Payer: BLUE CROSS/BLUE SHIELD | Admitting: Family Medicine

## 2021-07-25 ENCOUNTER — Other Ambulatory Visit: Payer: Self-pay

## 2021-07-25 ENCOUNTER — Emergency Department: Payer: BLUE CROSS/BLUE SHIELD

## 2021-07-25 ENCOUNTER — Encounter: Payer: Self-pay | Admitting: Physician Assistant

## 2021-07-25 ENCOUNTER — Emergency Department
Admission: EM | Admit: 2021-07-25 | Discharge: 2021-07-25 | Disposition: A | Discharge status: Home / Self Care | Payer: BLUE CROSS/BLUE SHIELD | Attending: Emergency Medicine | Admitting: Emergency Medicine

## 2021-07-25 DIAGNOSIS — Y92410 Unspecified street and highway as the place of occurrence of the external cause: Secondary | ICD-10-CM | POA: Diagnosis not present

## 2021-07-25 DIAGNOSIS — R0781 Pleurodynia: Secondary | ICD-10-CM | POA: Insufficient documentation

## 2021-07-25 DIAGNOSIS — R519 Headache, unspecified: Secondary | ICD-10-CM | POA: Insufficient documentation

## 2021-07-25 DIAGNOSIS — M542 Cervicalgia: Secondary | ICD-10-CM | POA: Diagnosis not present

## 2021-07-25 DIAGNOSIS — Z8616 Personal history of COVID-19: Secondary | ICD-10-CM | POA: Insufficient documentation

## 2021-07-25 DIAGNOSIS — Z96652 Presence of left artificial knee joint: Secondary | ICD-10-CM | POA: Diagnosis not present

## 2021-07-25 DIAGNOSIS — R0789 Other chest pain: Secondary | ICD-10-CM

## 2021-07-25 MED ORDER — TRAMADOL HCL 50 MG PO TABS: 50.0000 mg | ORAL_TABLET | Freq: Two times a day (BID) | ORAL | 0 refills | Status: AC

## 2021-07-25 MED ORDER — ONDANSETRON HCL 8 MG PO TABS: 8.0000 mg | ORAL_TABLET | Freq: Three times a day (TID) | ORAL | 0 refills | Status: AC | PRN

## 2021-07-25 MED ORDER — CYCLOBENZAPRINE HCL 5 MG PO TABS: 5.0000 mg | ORAL_TABLET | Freq: Three times a day (TID) | ORAL | 0 refills | Status: AC | PRN
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
## 2021-07-25 NOTE — Discharge Instructions (Addendum)
Your exam, CTs, and x-ray are all normal and reassuring at this time for no signs of a serious head or neck injury or rib fracture.  You can expect to be sore and stiff for the next several days.  Take the prescription meds as directed.  Follow with primary provider return to the ED if needed.

## 2021-11-26 ENCOUNTER — Other Ambulatory Visit: Payer: Self-pay | Admitting: Physician Assistant

## 2021-11-26 DIAGNOSIS — R519 Headache, unspecified: Secondary | ICD-10-CM
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
## 2022-12-08 DIAGNOSIS — Z111 Encounter for screening for respiratory tuberculosis: Secondary | ICD-10-CM | POA: Diagnosis not present

## 2023-01-07 DIAGNOSIS — E7800 Pure hypercholesterolemia, unspecified: Secondary | ICD-10-CM | POA: Diagnosis not present

## 2023-01-07 DIAGNOSIS — F430 Acute stress reaction: Secondary | ICD-10-CM | POA: Diagnosis not present

## 2023-02-02 DIAGNOSIS — Z03818 Encounter for observation for suspected exposure to other biological agents ruled out: Secondary | ICD-10-CM | POA: Diagnosis not present

## 2023-02-02 DIAGNOSIS — W57XXXA Bitten or stung by nonvenomous insect and other nonvenomous arthropods, initial encounter: Secondary | ICD-10-CM | POA: Diagnosis not present

## 2023-02-02 DIAGNOSIS — R110 Nausea: Secondary | ICD-10-CM | POA: Diagnosis not present

## 2023-02-02 DIAGNOSIS — S0006XA Insect bite (nonvenomous) of scalp, initial encounter: Secondary | ICD-10-CM | POA: Diagnosis not present

## 2023-02-02 DIAGNOSIS — R6889 Other general symptoms and signs: Secondary | ICD-10-CM | POA: Diagnosis not present

## 2023-02-02 DIAGNOSIS — S80862A Insect bite (nonvenomous), left lower leg, initial encounter: Secondary | ICD-10-CM | POA: Diagnosis not present

## 2023-02-19 DIAGNOSIS — W57XXXD Bitten or stung by nonvenomous insect and other nonvenomous arthropods, subsequent encounter: Secondary | ICD-10-CM | POA: Diagnosis not present

## 2023-02-19 DIAGNOSIS — R5383 Other fatigue: Secondary | ICD-10-CM | POA: Diagnosis not present

## 2023-02-19 DIAGNOSIS — L989 Disorder of the skin and subcutaneous tissue, unspecified: Secondary | ICD-10-CM | POA: Diagnosis not present

## 2023-04-06 DIAGNOSIS — W57XXXA Bitten or stung by nonvenomous insect and other nonvenomous arthropods, initial encounter: Secondary | ICD-10-CM | POA: Diagnosis not present

## 2023-04-06 DIAGNOSIS — R5383 Other fatigue: Secondary | ICD-10-CM | POA: Diagnosis not present

## 2023-04-06 DIAGNOSIS — R21 Rash and other nonspecific skin eruption: Secondary | ICD-10-CM | POA: Diagnosis not present

## 2023-04-06 DIAGNOSIS — M2550 Pain in unspecified joint: Secondary | ICD-10-CM | POA: Diagnosis not present

## 2023-06-25 ENCOUNTER — Other Ambulatory Visit: Payer: Self-pay | Admitting: Adult Health

## 2023-06-25 ENCOUNTER — Ambulatory Visit
Admission: RE | Admit: 2023-06-25 | Discharge: 2023-06-25 | Disposition: A | Discharge status: Home / Self Care | Payer: Self-pay | Attending: Adult Health | Admitting: Adult Health

## 2023-06-25 ENCOUNTER — Ambulatory Visit
Admission: RE | Admit: 2023-06-25 | Discharge: 2023-06-25 | Disposition: A | Discharge status: Home / Self Care | Payer: Self-pay | Source: Ambulatory Visit | Attending: Adult Health | Admitting: Adult Health

## 2023-06-25 DIAGNOSIS — Z9889 Other specified postprocedural states: Secondary | ICD-10-CM | POA: Insufficient documentation

## 2023-06-25 DIAGNOSIS — M25752 Osteophyte, left hip: Secondary | ICD-10-CM | POA: Insufficient documentation

## 2023-06-25 DIAGNOSIS — W101XXA Fall (on)(from) sidewalk curb, initial encounter: Secondary | ICD-10-CM

## 2023-06-25 DIAGNOSIS — M1612 Unilateral primary osteoarthritis, left hip: Secondary | ICD-10-CM | POA: Insufficient documentation

## 2023-06-25 DIAGNOSIS — M25551 Pain in right hip: Secondary | ICD-10-CM | POA: Insufficient documentation

## 2023-06-30 DIAGNOSIS — M25551 Pain in right hip: Secondary | ICD-10-CM | POA: Diagnosis not present

## 2023-07-10 DIAGNOSIS — M25551 Pain in right hip: Secondary | ICD-10-CM | POA: Diagnosis not present

## 2023-07-14 DIAGNOSIS — M8438XA Stress fracture, other site, initial encounter for fracture: Secondary | ICD-10-CM | POA: Diagnosis not present

## 2023-08-04 DIAGNOSIS — M8438XA Stress fracture, other site, initial encounter for fracture: Secondary | ICD-10-CM | POA: Diagnosis not present

## 2023-09-15 DIAGNOSIS — M8438XA Stress fracture, other site, initial encounter for fracture: Secondary | ICD-10-CM | POA: Diagnosis not present

## 2024-01-23 DIAGNOSIS — Z9181 History of falling: Secondary | ICD-10-CM | POA: Diagnosis not present

## 2024-01-23 DIAGNOSIS — Z8249 Family history of ischemic heart disease and other diseases of the circulatory system: Secondary | ICD-10-CM | POA: Diagnosis not present

## 2024-01-23 DIAGNOSIS — F325 Major depressive disorder, single episode, in full remission: Secondary | ICD-10-CM | POA: Diagnosis not present

## 2024-01-23 DIAGNOSIS — R030 Elevated blood-pressure reading, without diagnosis of hypertension: Secondary | ICD-10-CM | POA: Diagnosis not present

## 2024-06-08 DIAGNOSIS — Z681 Body mass index (BMI) 19 or less, adult: Secondary | ICD-10-CM | POA: Diagnosis not present

## 2024-06-08 DIAGNOSIS — Z9181 History of falling: Secondary | ICD-10-CM | POA: Diagnosis not present

## 2024-06-08 DIAGNOSIS — M810 Age-related osteoporosis without current pathological fracture: Secondary | ICD-10-CM | POA: Diagnosis not present

## 2024-06-08 DIAGNOSIS — Z008 Encounter for other general examination: Secondary | ICD-10-CM | POA: Diagnosis not present
